# Patient Record
Sex: Male | Born: 1960 | Race: White | Hispanic: No | Marital: Married | Smoking: Never smoker
Health system: Southern US, Community
[De-identification: ages and names within clinical notes are randomized; demographics above are authoritative.]

## PROBLEM LIST (undated history)

## (undated) HISTORY — PX: PROSTATECTOMY: SHX69

## (undated) HISTORY — PX: HERNIA REPAIR: SHX51

## (undated) HISTORY — PX: EYE SURGERY: SHX253

---

## 2018-11-22 ENCOUNTER — Encounter: Payer: Self-pay | Admitting: Gynecology

## 2018-11-22 ENCOUNTER — Ambulatory Visit
Admission: EM | Admit: 2018-11-22 | Discharge: 2018-11-22 | Disposition: A | Discharge status: Home / Self Care | Payer: 59 | Attending: Family Medicine | Admitting: Family Medicine

## 2018-11-22 ENCOUNTER — Other Ambulatory Visit: Payer: Self-pay

## 2018-11-22 DIAGNOSIS — L239 Allergic contact dermatitis, unspecified cause: Secondary | ICD-10-CM | POA: Diagnosis not present

## 2018-11-22 MED ORDER — PREDNISONE 20 MG PO TABS: ORAL_TABLET | ORAL | 0 refills | Status: DC

## 2018-11-22 NOTE — ED Provider Notes (Signed)
[facility]    CSN: [id number] Arrival date & time: 11/22/18  1321     History   Chief Complaint No chief complaint on file.   HPI Jose Holt is a 57 y.o. male.   57 yo male with a c/o itchy rash on neck, upper chest and arms for the past 2-3 days after what he thinks may have been exposure to poison oak, however states he's not positive if it was poison oak. Denies any wheezing, fever,  or shortness of breath.   The history is provided by the patient.    No past medical history on file.  There are no active problems to display for this patient.   Past Surgical History:  Procedure Laterality Date  . HERNIA REPAIR    . PROSTATECTOMY         Home Medications    Prior to Admission medications   Medication Sig Start Date End Date Taking? Authorizing Provider  predniSONE (DELTASONE) 20 MG tablet 2 tabs po qd x 5 days 11/22/18   Payton Mccallumonty, Daneka Lantigua, MD    Family History Family History  Problem Relation Age of Onset  . Hyperlipidemia Mother   . Hypertension Mother     Social History Social History   Tobacco Use  . Smoking status: Never Smoker  . Smokeless tobacco: Never Used  Substance Use Topics  . Alcohol use: Yes  . Drug use: Never     Allergies   Patient has no known allergies.   Review of Systems Review of Systems   Physical Exam Triage Vital Signs ED Triage Vitals  Enc Vitals Group     BP 11/22/18 1340 136/90     Pulse Rate 11/22/18 1340 63     Resp 11/22/18 1340 18     Temp 11/22/18 1340 98.2 F (36.8 C)     Temp Source 11/22/18 1340 Oral     SpO2 11/22/18 1340 99 %     Weight 11/22/18 1342 205 lb (93 kg)     Height --      Head Circumference --      Peak Flow --      Pain Score 11/22/18 1341 0     Pain Loc --      Pain Edu? --      Excl. in GC? --    No data found.  Updated Vital Signs BP 136/90 (BP Location: Left Arm)   Pulse 63   Temp 98.2 F (36.8 C) (Oral)   Resp 18   Wt 93 kg   SpO2 99%   Visual  Acuity Right Eye Distance:   Left Eye Distance:   Bilateral Distance:    Right Eye Near:   Left Eye Near:    Bilateral Near:     Physical Exam  Constitutional: He appears well-developed and well-nourished. No distress.  Cardiovascular: Normal rate, regular rhythm and normal heart sounds.  Pulmonary/Chest: Effort normal and breath sounds normal. No stridor. No respiratory distress. He has no wheezes. He has no rales.  Skin: Rash noted. Rash is maculopapular. He is not diaphoretic. There is erythema.     Nursing note and vitals reviewed.    UC Treatments / Results  Labs (all labs ordered are listed, but only abnormal results are displayed) Labs Reviewed - No data to display  EKG None  Radiology No results found.  Procedures Procedures (including critical care time)  Medications Ordered in UC Medications - No data to display  Initial Impression / Assessment  and Plan / UC Course  I have reviewed the triage vital signs and the nursing notes.  Pertinent labs & imaging results that were available during my care of the patient were reviewed by me and considered in my medical decision making (see chart for details).      Final Clinical Impressions(s) / UC Diagnoses   Final diagnoses:  Allergic contact dermatitis, unspecified trigger     Discharge Instructions     Benadryl at night Zyrtec or claritin in the morning    ED Prescriptions    Medication Sig Dispense Auth. Provider   predniSONE (DELTASONE) 20 MG tablet 2 tabs po qd x 5 days 10 tablet Shebra Muldrow, Pamala Hurry, MD     1. diagnosis reviewed with patient 2. rx as per orders above; reviewed possible side effects, interactions, risks and benefits  3. Recommend supportive treatment as above  4. Follow-up prn if symptoms worsen or don't improve   Controlled Substance Prescriptions [place] Controlled Substance Registry consulted? Not Applicable   Payton Mccallum, MD 11/22/18 [phone]

## 2018-11-22 NOTE — ED Triage Notes (Signed)
Patient with rash all over body. Per patient question poison oaks.

## 2018-11-22 NOTE — Discharge Instructions (Signed)
Benadryl at night Zyrtec or claritin in the morning

## 2020-04-13 ENCOUNTER — Ambulatory Visit (INDEPENDENT_AMBULATORY_CARE_PROVIDER_SITE_OTHER): Payer: 59

## 2020-04-13 ENCOUNTER — Ambulatory Visit
Admission: EM | Admit: 2020-04-13 | Discharge: 2020-04-13 | Disposition: A | Discharge status: Home / Self Care | Payer: 59 | Attending: Family Medicine | Admitting: Family Medicine

## 2020-04-13 ENCOUNTER — Other Ambulatory Visit: Payer: Self-pay

## 2020-04-13 DIAGNOSIS — Z20822 Contact with and (suspected) exposure to covid-19: Secondary | ICD-10-CM | POA: Diagnosis present

## 2020-04-13 DIAGNOSIS — R05 Cough: Secondary | ICD-10-CM | POA: Insufficient documentation

## 2020-04-13 DIAGNOSIS — R059 Cough, unspecified: Secondary | ICD-10-CM

## 2020-04-13 DIAGNOSIS — J069 Acute upper respiratory infection, unspecified: Secondary | ICD-10-CM

## 2020-04-13 MED ORDER — LEVOFLOXACIN 500 MG PO TABS: 500.0000 mg | ORAL_TABLET | Freq: Every day | ORAL | 0 refills | Status: DC

## 2020-04-13 MED ORDER — PREDNISONE 20 MG PO TABS: 40.0000 mg | ORAL_TABLET | Freq: Every day | ORAL | 0 refills | Status: DC

## 2020-04-13 MED ORDER — HYDROCOD POLST-CPM POLST ER 10-8 MG/5ML PO SUER: 5.0000 mL | Freq: Two times a day (BID) | ORAL | 0 refills | Status: DC | PRN

## 2020-04-13 NOTE — ED Provider Notes (Signed)
[place], [place]   Name: Jose Holt DOB: 06/18/1961 MRN: [id number] CSN: [id number] PCP: System, Pcp Not In  Arrival date and time:  04/13/20 1752  Chief Complaint:  Nasal Congestion and Sore Throat  NOTE: Prior to seeing the patient today, I have reviewed the triage nursing documentation and vital signs. Clinical staff has updated patient's PMH/PSHx, current medication list, and drug allergies/intolerances to ensure comprehensive history available to assist in medical decision making.   History:   HPI: Jose Holt is a 59 y.o. male who presents today with complaints of fatigue, congestion, chest tightness, and a sore throat that has been going on since the "first week in April". Patient denies any associated fevers. Patient has been seen by his PCP and treated for a sinusitis with a 10 day course of an unknown antibiotic. Patient reports that he felt "some better" while on the antibiotics, however with completion of the prescribed course, he began to feel worse again. He denies that he has experienced any nausea, vomiting, diarrhea, or abdominal pain. He is eating and drinking well. Patient denies any perceived alterations to his sense of taste or smell. He has not been tested for SARS-CoV-2 (novel coronavirus) in the past 14 days; he is unsure when her last tested negative per his report. Despite his symptoms, patient has not taken any over the counter interventions to help improve/relieve his reported symptoms at home.   History reviewed. No pertinent past medical history.  Past Surgical History:  Procedure Laterality Date  . HERNIA REPAIR    . PROSTATECTOMY      Family History  Problem Relation Age of Onset  . Hyperlipidemia Mother   . Hypertension Mother     Social History   Tobacco Use  . Smoking status: Never Smoker  . Smokeless tobacco: Never Used  Substance Use Topics  . Alcohol use: Yes    Comment: social  . Drug use: Never    There are no problems to display for  this patient.   Home Medications:    No outpatient medications have been marked as taking for the 04/13/20 encounter [facility] Encounter).    Allergies:   Patient has no known allergies.  Review of Systems (ROS):  Review of systems NEGATIVE unless otherwise noted in narrative H&P section.   Vital Signs: Today's Vitals   04/13/20 1852 04/13/20 1854 04/13/20 2013  BP: (!) 141/81    Pulse: 75    Resp: 18    Temp: 98 F (36.7 C)    TempSrc: Oral    SpO2: 97%    Weight:  205 lb (93 kg)   Height:  5\' 7"  (1.702 m)   PainSc:  6  5     Physical Exam: Physical Exam  Constitutional: He is oriented to person, place, and time and well-developed, well-nourished, and in no distress.  Acutely ill appearing; fatigued.   HENT:  Head: Normocephalic and atraumatic.  Right Ear: Tympanic membrane normal.  Left Ear: Tympanic membrane normal.  Nose: Mucosal edema, rhinorrhea and sinus tenderness (mild) present.  Mouth/Throat: Uvula is midline and mucous membranes are normal. Posterior oropharyngeal erythema present. No oropharyngeal exudate or posterior oropharyngeal edema.  Eyes: Pupils are equal, round, and reactive to light.  Cardiovascular: Normal rate, regular rhythm, normal heart sounds and intact distal pulses.  Pulmonary/Chest: Effort normal. He has rhonchi (scattered; not clearing with cough).  Significant cough noted in clinic. No SOB or increased WOB. No distress. Able to speak in complete sentences without difficulties. SPO2  97% on RA.  Lymphadenopathy:       Head (right side): Submandibular adenopathy present.       Head (left side): Submandibular adenopathy present.  Neurological: He is alert and oriented to person, place, and time. Gait normal.  Skin: Skin is warm and dry. No rash noted. He is not diaphoretic.  Psychiatric: Mood, memory, affect and judgment normal.  Nursing note and vitals reviewed.   Urgent Care Treatments / Results:   Orders Placed This Encounter    Procedures  . SARS CORONAVIRUS 2 (TAT 6-24 HRS) Nasopharyngeal Nasopharyngeal Swab  . DG Chest 2 View    LABS: PLEASE NOTE: all labs that were ordered this encounter are listed, however only abnormal results are displayed. Labs Reviewed  SARS CORONAVIRUS 2 (TAT 6-24 HRS)    EKG: -None  RADIOLOGY: DG Chest 2 View  Result Date: 04/13/2020 CLINICAL DATA:  Cough, shortness of breath EXAM: CHEST - 2 VIEW COMPARISON:  None. FINDINGS: The heart size and mediastinal contours are within normal limits. Both lungs are clear. The visualized skeletal structures are unremarkable. IMPRESSION: No active cardiopulmonary disease. Electronically Signed   By: Duanne Guess D.O.   On: 04/13/2020 19:36    PROCEDURES: Procedures  MEDICATIONS RECEIVED THIS VISIT: Medications - No data to display  PERTINENT CLINICAL COURSE NOTES/UPDATES:   Initial Impression / Assessment and Plan / Urgent Care Course:  Pertinent labs & imaging results that were available during my care of the patient were personally reviewed by me and considered in my medical decision making (see lab/imaging section of note for values and interpretations).  Jose Holt is a 59 y.o. male who presents to [facility] today with complaints of Nasal Congestion and Sore Throat  Patient acutely ill appearing (non-toxic) appearing in clinic today. He does not appear to be in any acute distress. He does not appear to be in any acute distress. Presenting symptoms (see HPI) and exam as documented above. SARS-CoV-2 (novel coronavirus) testing performed today. Radiographs of the chest no evidence of peribronchial thickening, areas of consolidation, or focal infiltrates. Suspect persistence of sinusitis/URI. Given extent and chronicity of symptoms, will proceed with treatment using a 5 day course of levofloxacin and steroids. Discussed supportive care measures at home during acute phase of illness. Patient to rest as much as possible. He was  encouraged to ensure adequate hydration (water and ORS) to prevent dehydration and electrolyte derangements. Patient may use APAP and/or IBU on an as needed basis for pain/fever. Cough is significant and preventing sleep. Will send in a supply of Tussionex for PRN use. He was educated on the indications and associated side effects of this medication.  Discussed follow up with primary care physician in 1 week for re-evaluation. I have reviewed the follow up and strict return precautions for any new or worsening symptoms. Patient is aware of symptoms that would be deemed urgent/emergent, and would thus require further evaluation either here or in the emergency department. At the time of discharge, he verbalized understanding and consent with the discharge plan as it was reviewed with him. All questions were fielded by provider and/or clinic staff prior to patient discharge.    Final Clinical Impressions / Urgent Care Diagnoses:   Final diagnoses:  Upper respiratory tract infection, unspecified type  Cough  Encounter for laboratory testing for COVID-19 virus    New Prescriptions:  [place] Controlled Substance Registry consulted? Yes, I have consulted the [place] Controlled Substances Registry for this patient, and feel the risk/benefit  ratio today is favorable for proceeding with this prescription for a controlled substance.  . Discussed use of controlled substance medication to treat his acute symptoms.  o Reviewed [place] STOP Act regulations  o Clinic does not refill controlled substances over the phone without face to face evaluation.  . Safety precautions reviewed.  o Medications should not be sold or taken with alcohol.  o Avoid use while working, driving, or operating heavy machinery.  o Side effects associated with the use of this particular medication reviewed. - Patient understands that this medication can cause CNS depression, increase his risk of falls, and even lead to overdose that may result in  death, if used outside of the parameters that he and I discussed.  With all of this in mind, he knowingly accepts the risks and responsibilities associated with intended course of treatment, and elects to responsibly proceed as discussed.  Meds ordered this encounter  Medications  . predniSONE (DELTASONE) 20 MG tablet    Sig: Take 2 tablets (40 mg total) by mouth daily.    Dispense:  10 tablet    Refill:  0  . chlorpheniramine-HYDROcodone (TUSSIONEX PENNKINETIC ER) 10-8 MG/5ML SUER    Sig: Take 5 mLs by mouth every 12 (twelve) hours as needed for cough. Will cause drowsiness; NO DRIVING.    Dispense:  70 mL    Refill:  0  . levofloxacin (LEVAQUIN) 500 MG tablet    Sig: Take 1 tablet (500 mg total) by mouth daily.    Dispense:  5 tablet    Refill:  0    Recommended Follow up Care:  Patient encouraged to follow up with the following provider within the specified time frame, or sooner as dictated by the severity of his symptoms. As always, he was instructed that for any urgent/emergent care needs, he should seek care either here or in the emergency department for more immediate evaluation.  Follow-up Information    PCP In 1 week.   Why: General reassessment of symptoms if not improving        NOTE: This note was prepared using Scientist, clinical (histocompatibility and immunogenetics) along with smaller Lobbyist. Despite my best ability to proofread, there is the potential that transcriptional errors may still occur from this process, and are completely unintentional.    Verlee Monte, NP 04/13/20 [phone]

## 2020-04-13 NOTE — ED Triage Notes (Signed)
"  I'm completely clogged up." Finished ABX for sinus infection and started to feel worse again. Chest is tight, spitting up phlegm greenish colored.

## 2020-04-13 NOTE — Discharge Instructions (Addendum)
It was very nice seeing you today in clinic. Thank you for entrusting me with your care.   Rest and Stay HYDRATED. Water and electrolyte containing beverages (Gatorade, Pedialyte) are best to prevent dehydration and electrolyte abnormalities. May use Tylenol and/or Ibuprofen as needed for pain/fever.  You were tested for SARS-CoV-2 (novel coronavirus) today. Testing is being processed at the main campus of [facility] in [place], and have been taking 12-24 hours to come back. Current recommendations from the the CDC and [place] DHHS require that you remain out of work in order to quarantine at home until negative test results are have been received. In the event that your test results are positive, you will be contacted with further directives. These measures are being implemented out of an abundance of caution to prevent transmission and spread during the current SARS-CoV-2 pandemic.  Make arrangements to follow up with your regular doctor in 1 week for re-evaluation if not improving. If your symptoms/condition worsens, please seek follow up care either here or in the ER. Please remember, our [place] providers are "right here with you" when you need us.   Again, it was my pleasure to take care of you today. Thank you for choosing our clinic. I hope that you start to feel better quickly.   Daeton Kluth, MSN, APRN, FNP-C, CEN Advanced Practice Provider [facility] 

## 2020-04-14 LAB — SARS CORONAVIRUS 2 (TAT 6-24 HRS): SARS Coronavirus 2: NEGATIVE

## 2021-07-20 IMAGING — CR DG CHEST 2V
2 series · 2 of 2 positions shown · non-contrast
Comparison: None.

CLINICAL DATA: Cough, shortness of breath

EXAM:
CHEST - 2 VIEW

[chest pa]
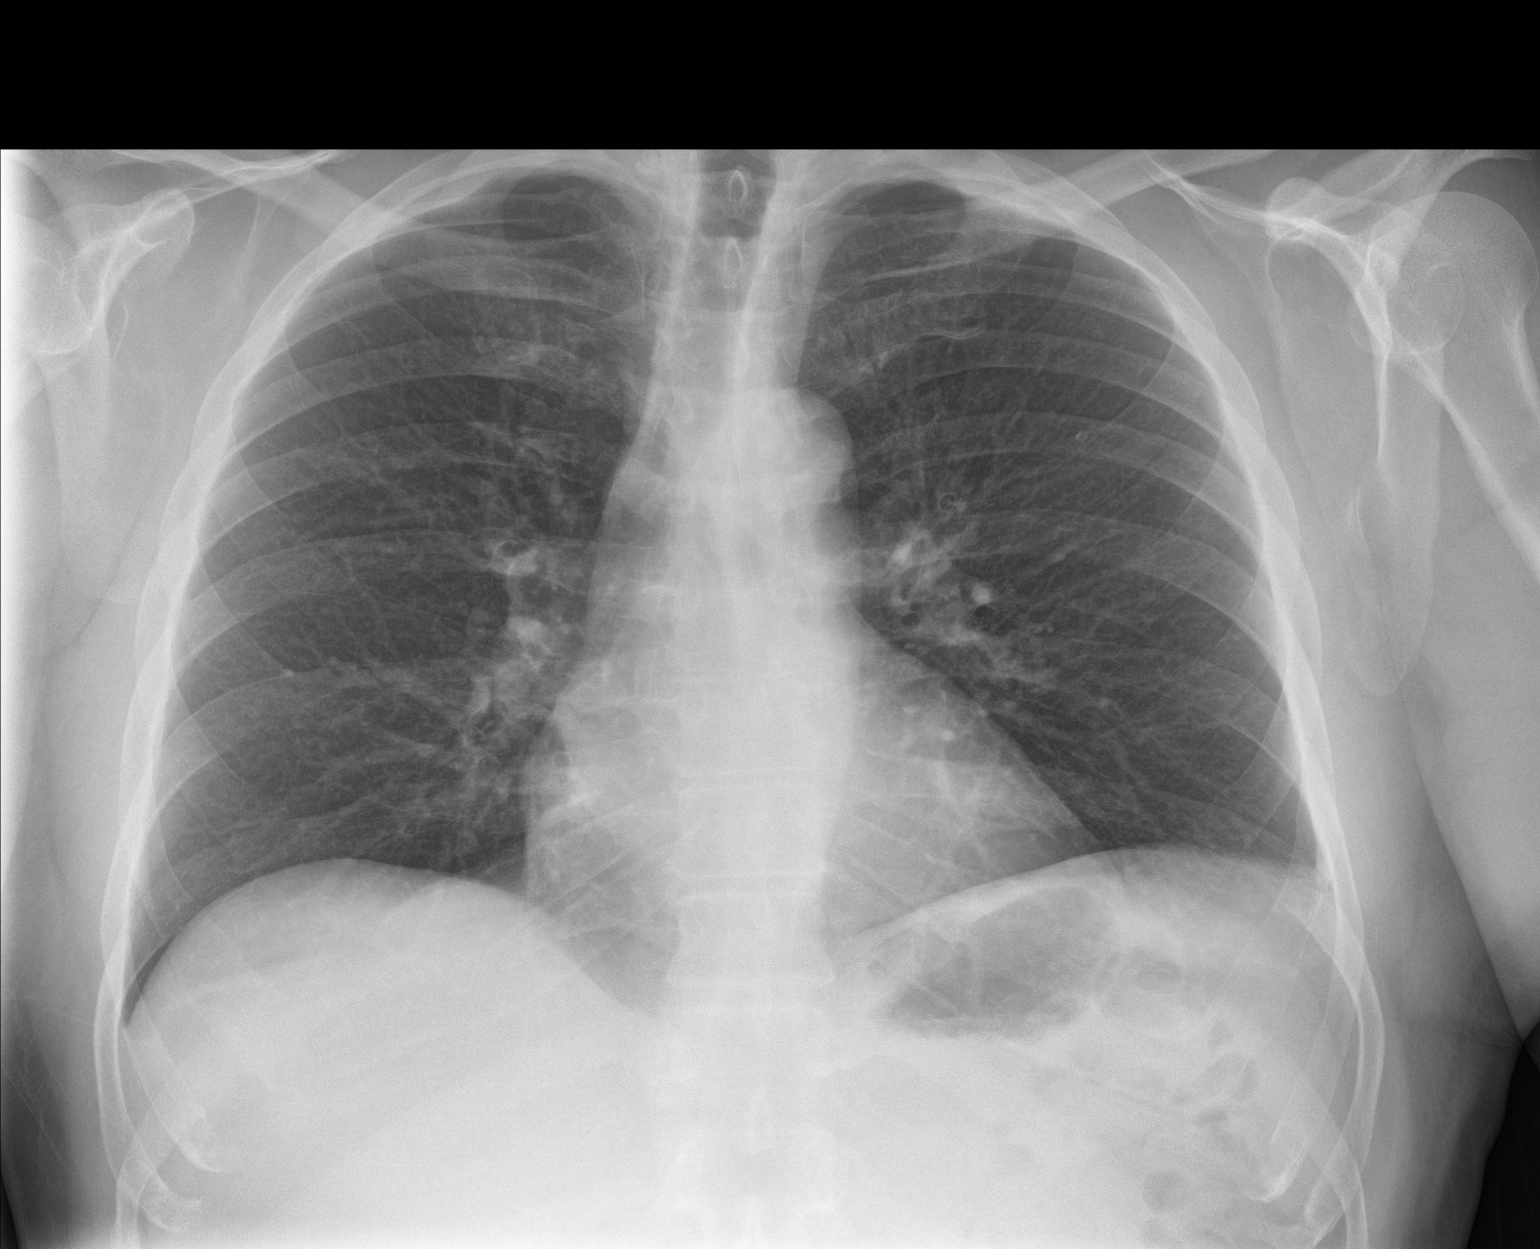

[chest lat]
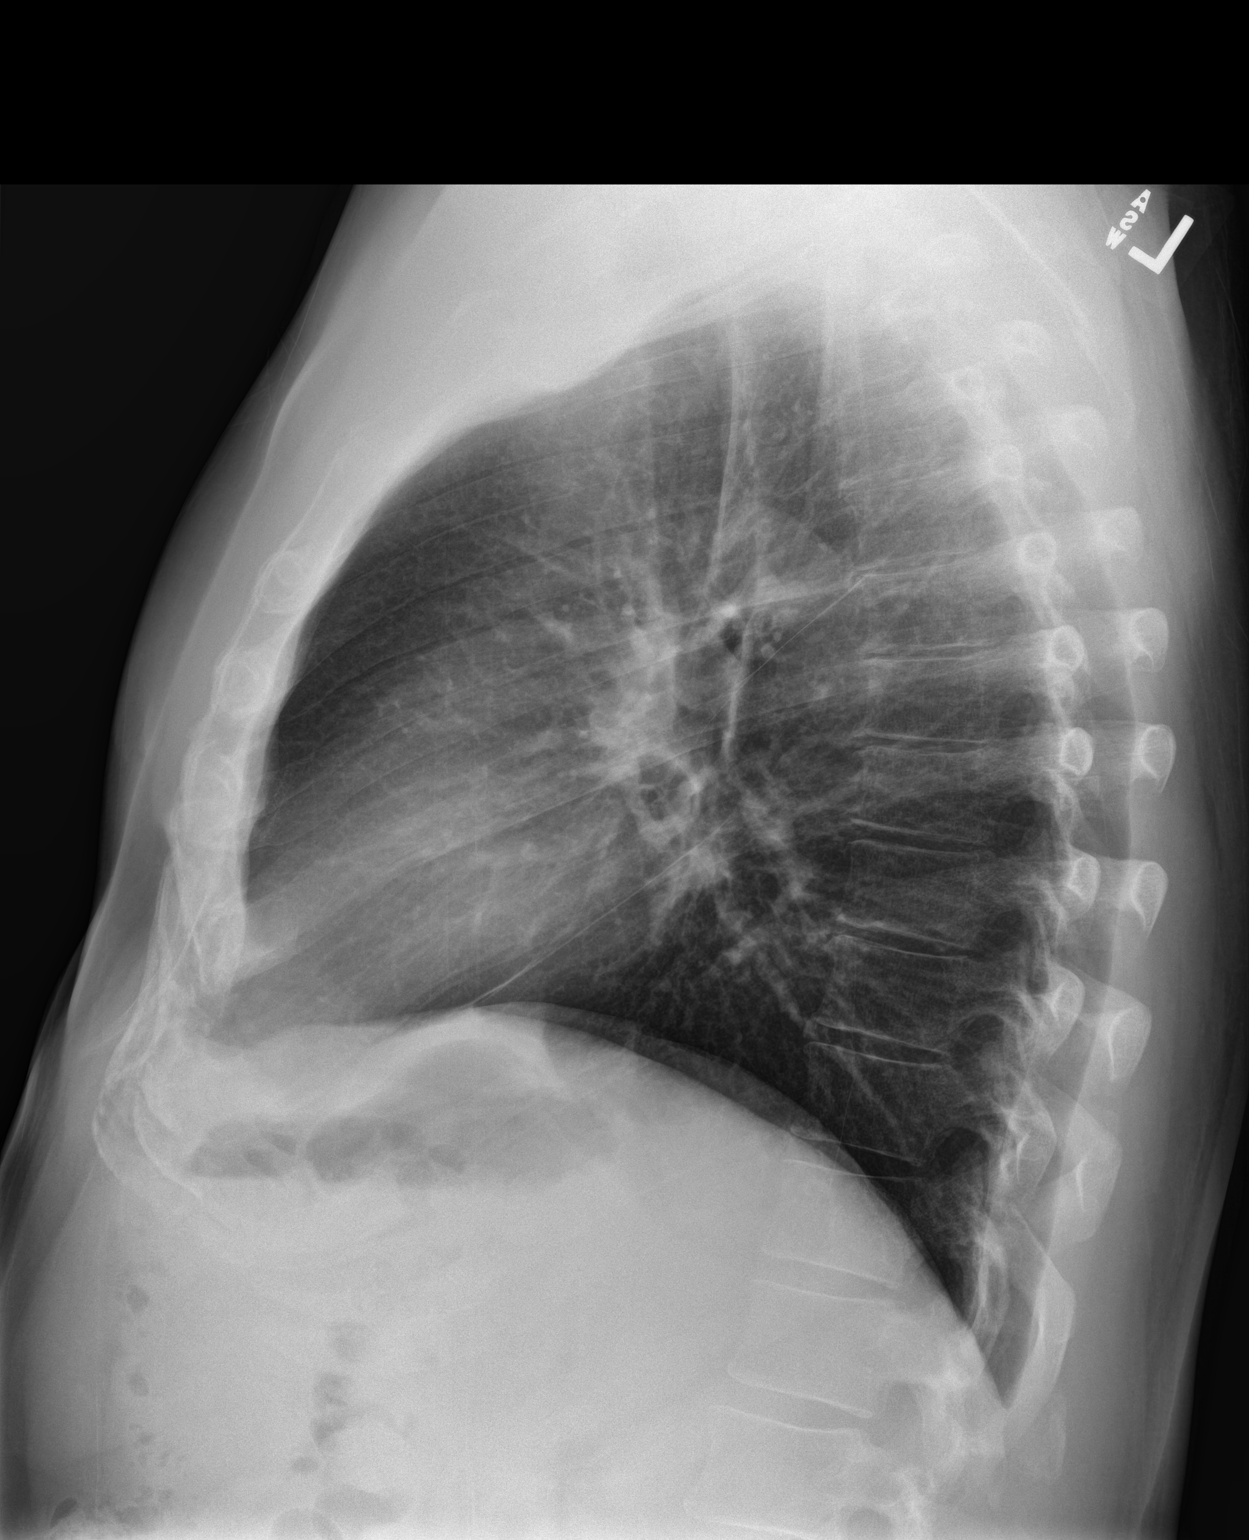

[2 of 2 positions shown; findings below may reference images not displayed]

FINDINGS: The heart size and mediastinal contours are within normal limits.
Both lungs are clear. The visualized skeletal structures are
unremarkable.
IMPRESSION: No active cardiopulmonary disease.

## 2021-11-12 ENCOUNTER — Ambulatory Visit: Payer: Self-pay

## 2024-10-02 ENCOUNTER — Ambulatory Visit
Admission: EM | Admit: 2024-10-02 | Discharge: 2024-10-02 | Disposition: A | Discharge status: Home / Self Care | Attending: Emergency Medicine | Admitting: Emergency Medicine

## 2024-10-02 ENCOUNTER — Encounter: Payer: Self-pay | Admitting: Emergency Medicine

## 2024-10-02 DIAGNOSIS — L0100 Impetigo, unspecified: Secondary | ICD-10-CM

## 2024-10-02 MED ORDER — DOXYCYCLINE HYCLATE 100 MG PO CAPS: 100.0000 mg | ORAL_CAPSULE | Freq: Two times a day (BID) | ORAL | 0 refills | Status: AC

## 2024-10-02 NOTE — Discharge Instructions (Addendum)
 Take the doxycycline twice daily with food for 7 days for treatment of your staph infection.  For pain control I suggest you take 1000 mg of Tylenol along with 600 mg of ibuprofen every 6 hours with food.  Apply warm compresses to the lesion on your upper lip to see if you get it to come to ahead, rupture, and drain on its own.  If you develop any increased pain, facial swelling, or fevers you need to go to the ER for evaluation.

## 2024-10-02 NOTE — ED Triage Notes (Signed)
 Patient reports red and painful bump above his lip on Tuesday.  Patient reports increase in swelling and pain around his lips.  Patient has been taking Motrin for pain.

## 2024-10-02 NOTE — ED Provider Notes (Signed)
 [facility]    CSN: [id number] Arrival date & time: 10/02/24  1119      History   Chief Complaint Chief Complaint  Patient presents with   Rash    HPI Jose Holt is a 63 y.o. male.   HPI  63 year old male with past medical history significant for prostate cancer status post prostatectomy, ED, moderate OSA presents for evaluation of a painful lip lesion that he first noticed 4 days ago.  He reports that it is very painful to the touch, he is swelling to his upper lip, and it is expressing some bloody discharge.  He denies any fever.  History reviewed. No pertinent past medical history.  There are no active problems to display for this patient.   Past Surgical History:  Procedure Laterality Date   EYE SURGERY     HERNIA REPAIR     PROSTATECTOMY         Home Medications    Prior to Admission medications   Medication Sig Start Date End Date Taking? Authorizing Provider  doxycycline (VIBRAMYCIN) 100 MG capsule Take 1 capsule (100 mg total) by mouth 2 (two) times daily for 7 days. 10/02/24 10/09/24 Yes Bernardino Ditch, NP    Family History Family History  Problem Relation Age of Onset   Hyperlipidemia Mother    Hypertension Mother     Social History Social History   Tobacco Use   Smoking status: Never   Smokeless tobacco: Never  Vaping Use   Vaping status: Never Used  Substance Use Topics   Alcohol use: Yes    Comment: social   Drug use: Never     Allergies   Patient has no known allergies.   Review of Systems Review of Systems  Constitutional:  Negative for fever.  Skin:  Positive for color change and wound.     Physical Exam Triage Vital Signs ED Triage Vitals  Encounter Vitals Group     BP      Girls Systolic BP Percentile      Girls Diastolic BP Percentile      Boys Systolic BP Percentile      Boys Diastolic BP Percentile      Pulse      Resp      Temp      Temp src      SpO2      Weight      Height      Head  Circumference      Peak Flow      Pain Score      Pain Loc      Pain Education      Exclude from Growth Chart    No data found.  Updated Vital Signs BP 136/81 (BP Location: Left Arm)   Pulse 62   Temp 98.1 F (36.7 C) (Oral)   Resp 16   Ht 5' 7 (1.702 m)   Wt 205 lb 0.4 oz (93 kg)   SpO2 100%   BMI 32.11 kg/m   Visual Acuity Right Eye Distance:   Left Eye Distance:   Bilateral Distance:    Right Eye Near:   Left Eye Near:    Bilateral Near:     Physical Exam Vitals and nursing note reviewed.  Constitutional:      Appearance: Normal appearance. He is not ill-appearing.  HENT:     Head: Normocephalic and atraumatic.     Mouth/Throat:     Mouth: Mucous membranes are moist.  Pharynx: Oropharynx is clear. No oropharyngeal exudate or posterior oropharyngeal erythema.  Skin:    General: Skin is warm and dry.     Capillary Refill: Capillary refill takes less than 2 seconds.     Findings: Erythema and lesion present.  Neurological:     General: No focal deficit present.     Mental Status: He is alert and oriented to person, place, and time.      UC Treatments / Results  Labs (all labs ordered are listed, but only abnormal results are displayed) Labs Reviewed - No data to display  EKG   Radiology No results found.  Procedures Procedures (including critical care time)  Medications Ordered in UC Medications - No data to display  Initial Impression / Assessment and Plan / UC Course  I have reviewed the triage vital signs and the nursing notes.  Pertinent labs & imaging results that were available during my care of the patient were reviewed by me and considered in my medical decision making (see chart for details).   Patient is a nontoxic-appearing 63 year old male presenting for evaluation of a lesion on his upper lip that has been present for last 4 days.  Patient seen image above, there is a central lesion that has a yellow pustule surrounded by some  erythematous tissue that is also indurated.  The patient reports that his whole upper lip and the inner aspect of his upper lip are painful.  I do not appreciate any intraoral lesions.  He denies any history of MRSA though given the pain that he is experiencing I am concerned there may possibly be MRSA involved.  I will discharge patient home on doxycycline 100 mg twice daily for 7 days for treatment of his skin lesion and have him apply warm compresses to the area to see if he can get it to come to ahead, rupture, and drained on its own.  He may take 1000 mg of Tylenol along with 600 milligrams of ibuprofen every 6 hours with food as needed for pain.  I have advised him that if his pain increase, he develops more facial swelling, or he starts running fevers he needs to go to the ER for evaluation.   Final Clinical Impressions(s) / UC Diagnoses   Final diagnoses:  Impetigo     Discharge Instructions      Take the doxycycline twice daily with food for 7 days for treatment of your staph infection.  For pain control I suggest you take 1000 mg of Tylenol along with 600 mg of ibuprofen every 6 hours with food.  Apply warm compresses to the lesion on your upper lip to see if you get it to come to ahead, rupture, and drain on its own.  If you develop any increased pain, facial swelling, or fevers you need to go to the ER for evaluation.     ED Prescriptions     Medication Sig Dispense Auth. Provider   doxycycline (VIBRAMYCIN) 100 MG capsule Take 1 capsule (100 mg total) by mouth 2 (two) times daily for 7 days. 14 capsule Bernardino Ditch, NP      PDMP not reviewed this encounter.   Bernardino Ditch, NP 10/02/24 1234
